# Patient Record
Sex: Female | Born: 1995 | Race: Black or African American | Hispanic: No | Marital: Single | Smoking: Never smoker
Health system: Southern US, Community
[De-identification: ages and names within clinical notes are randomized; demographics above are authoritative.]

## PROBLEM LIST (undated history)

## (undated) ENCOUNTER — Ambulatory Visit: Admission: EM | Disposition: A | Discharge status: Home / Self Care | Payer: 59

## (undated) DIAGNOSIS — Z789 Other specified health status: Secondary | ICD-10-CM

## (undated) HISTORY — PX: NO PAST SURGERIES: SHX2092

---

## 2016-07-06 ENCOUNTER — Ambulatory Visit (HOSPITAL_COMMUNITY)
Admission: EM | Admit: 2016-07-06 | Discharge: 2016-07-06 | Discharge status: Absent without leave | Payer: BC Managed Care – PPO

## 2016-07-06 ENCOUNTER — Emergency Department (HOSPITAL_COMMUNITY)
Admission: EM | Admit: 2016-07-06 | Discharge: 2016-07-06 | Disposition: A | Discharge status: Home / Self Care | Payer: BC Managed Care – PPO | Attending: Emergency Medicine | Admitting: Emergency Medicine

## 2016-07-06 ENCOUNTER — Encounter (HOSPITAL_COMMUNITY): Payer: Self-pay

## 2016-07-06 DIAGNOSIS — Z5321 Procedure and treatment not carried out due to patient leaving prior to being seen by health care provider: Secondary | ICD-10-CM | POA: Insufficient documentation

## 2016-07-06 DIAGNOSIS — R1084 Generalized abdominal pain: Secondary | ICD-10-CM | POA: Insufficient documentation

## 2016-07-06 LAB — COMPREHENSIVE METABOLIC PANEL
ALT: 44 U/L (ref 14–54)
AST: 38 U/L (ref 15–41)
Albumin: 3.5 g/dL (ref 3.5–5.0)
Alkaline Phosphatase: 45 U/L (ref 38–126)
Anion gap: 11 (ref 5–15)
BUN: 11 mg/dL (ref 6–20)
CO2: 21 mmol/L — ABNORMAL LOW (ref 22–32)
CREATININE: 0.82 mg/dL (ref 0.44–1.00)
Calcium: 9.6 mg/dL (ref 8.9–10.3)
Chloride: 106 mmol/L (ref 101–111)
GFR calc Af Amer: >60 mL/min (ref >60)
GLUCOSE: 101 mg/dL — AB (ref 65–99)
Potassium: 3.2 mmol/L — ABNORMAL LOW (ref 3.5–5.1)
SODIUM: 138 mmol/L (ref 135–145)
TOTAL PROTEIN: 7.9 g/dL (ref 6.5–8.1)
Total Bilirubin: 0.6 mg/dL (ref 0.3–1.2)

## 2016-07-06 LAB — URINALYSIS, ROUTINE W REFLEX MICROSCOPIC
GLUCOSE, UA: NEGATIVE mg/dL
KETONES UR: 15 mg/dL — AB
LEUKOCYTES UA: NEGATIVE
NITRITE: NEGATIVE
PH: 6 (ref 5.0–8.0)
PROTEIN: 100 mg/dL — AB
Specific Gravity, Urine: 1.03 (ref 1.005–1.030)

## 2016-07-06 LAB — CBC
HCT: 41.8 % (ref 36.0–46.0)
Hemoglobin: 14 g/dL (ref 12.0–15.0)
MCH: 30 pg (ref 26.0–34.0)
MCHC: 33.5 g/dL (ref 30.0–36.0)
MCV: 89.7 fL (ref 78.0–100.0)
PLATELETS: 310 10*3/uL (ref 150–400)
RBC: 4.66 MIL/uL (ref 3.87–5.11)
RDW: 13 % (ref 11.5–15.5)
WBC: 10.2 10*3/uL (ref 4.0–10.5)

## 2016-07-06 LAB — URINE MICROSCOPIC-ADD ON
BACTERIA UA: NONE SEEN
RBC / HPF: NONE SEEN RBC/hpf (ref 0–5)
WBC, UA: NONE SEEN WBC/hpf (ref 0–5)

## 2016-07-06 LAB — I-STAT BETA HCG BLOOD, ED (MC, WL, AP ONLY): I-stat hCG, quantitative: <5 m[IU]/mL (ref <5)

## 2016-07-06 LAB — LIPASE, BLOOD: LIPASE: 21 U/L (ref 11–51)

## 2016-07-06 NOTE — ED Notes (Signed)
Name called x 2, no answer.

## 2016-07-06 NOTE — ED Triage Notes (Signed)
Patient here with generalized abdominal pain with nausea and vomiting since Thursday. Denies diarrhea. Describes the abdominal discomfort as cramping.

## 2016-08-01 ENCOUNTER — Encounter (HOSPITAL_COMMUNITY): Payer: Self-pay | Admitting: Emergency Medicine

## 2016-08-01 ENCOUNTER — Encounter (HOSPITAL_COMMUNITY): Payer: Self-pay | Admitting: *Deleted

## 2016-08-01 ENCOUNTER — Ambulatory Visit (HOSPITAL_COMMUNITY)
Admission: EM | Admit: 2016-08-01 | Discharge: 2016-08-01 | Disposition: A | Discharge status: Nursing Home (Includes ICF) | Payer: BC Managed Care – PPO | Attending: Family Medicine | Admitting: Family Medicine

## 2016-08-01 DIAGNOSIS — N390 Urinary tract infection, site not specified: Secondary | ICD-10-CM | POA: Diagnosis not present

## 2016-08-01 DIAGNOSIS — R1110 Vomiting, unspecified: Secondary | ICD-10-CM | POA: Diagnosis not present

## 2016-08-01 DIAGNOSIS — R112 Nausea with vomiting, unspecified: Secondary | ICD-10-CM | POA: Diagnosis not present

## 2016-08-01 DIAGNOSIS — R1115 Cyclical vomiting syndrome unrelated to migraine: Secondary | ICD-10-CM

## 2016-08-01 LAB — CBC
HCT: 41.3 % (ref 36.0–46.0)
HEMOGLOBIN: 14 g/dL (ref 12.0–15.0)
MCH: 29.7 pg (ref 26.0–34.0)
MCHC: 33.9 g/dL (ref 30.0–36.0)
MCV: 87.7 fL (ref 78.0–100.0)
Platelets: 367 10*3/uL (ref 150–400)
RBC: 4.71 MIL/uL (ref 3.87–5.11)
RDW: 12.8 % (ref 11.5–15.5)
WBC: 16.6 10*3/uL — ABNORMAL HIGH (ref 4.0–10.5)

## 2016-08-01 LAB — COMPREHENSIVE METABOLIC PANEL
ALBUMIN: 3.5 g/dL (ref 3.5–5.0)
ALK PHOS: 59 U/L (ref 38–126)
ALT: 31 U/L (ref 14–54)
ANION GAP: 13 (ref 5–15)
AST: 30 U/L (ref 15–41)
BILIRUBIN TOTAL: 0.4 mg/dL (ref 0.3–1.2)
CALCIUM: 9.8 mg/dL (ref 8.9–10.3)
CO2: 22 mmol/L (ref 22–32)
Chloride: 103 mmol/L (ref 101–111)
Creatinine, Ser: 0.76 mg/dL (ref 0.44–1.00)
GFR calc Af Amer: >60 mL/min (ref >60)
GFR calc non Af Amer: >60 mL/min (ref >60)
GLUCOSE: 92 mg/dL (ref 65–99)
Potassium: 4.1 mmol/L (ref 3.5–5.1)
SODIUM: 138 mmol/L (ref 135–145)
Total Protein: 8.3 g/dL — ABNORMAL HIGH (ref 6.5–8.1)

## 2016-08-01 LAB — URINALYSIS, ROUTINE W REFLEX MICROSCOPIC
Glucose, UA: NEGATIVE mg/dL
HGB URINE DIPSTICK: NEGATIVE
KETONES UR: 15 mg/dL — AB
Nitrite: POSITIVE — AB
PH: 6 (ref 5.0–8.0)
Protein, ur: 100 mg/dL — AB
SPECIFIC GRAVITY, URINE: 1.031 — AB (ref 1.005–1.030)

## 2016-08-01 LAB — URINE MICROSCOPIC-ADD ON

## 2016-08-01 LAB — POC URINE PREG, ED: PREG TEST UR: NEGATIVE

## 2016-08-01 MED ORDER — ONDANSETRON 4 MG PO TBDP
ORAL_TABLET | ORAL | Status: AC
  Filled 2016-08-01: qty 1

## 2016-08-01 MED ORDER — ONDANSETRON 4 MG PO TBDP
4.0000 mg | ORAL_TABLET | Freq: Once | ORAL | Status: AC
  Administered 2016-08-01: 4 mg via ORAL

## 2016-08-01 NOTE — ED Triage Notes (Signed)
Pt c/o NV and abdominal pain onset today. Has had 6-7 episodes of emesis today. [facility] and sent to ER for IV fluids. Denies nausea. Was given nausea medication at [facility].

## 2016-08-01 NOTE — ED Provider Notes (Signed)
[facility]    CSN: [id number] Arrival date & time: 08/01/16  1826     History   Chief Complaint No chief complaint on file.   HPI Cassandra Ali is a 20 y.o. female.    Emesis  Severity:  Moderate Duration:  1 day Number of daily episodes:  6 Quality:  Stomach contents Progression:  Worsening Chronicity:  Recurrent Recent urination:  Normal Relieved by:  Nothing Worsened by:  Nothing Ineffective treatments:  None tried Associated symptoms: no abdominal pain, no diarrhea and no fever   Risk factors: not pregnant, no sick contacts and no suspect food intake   Risk factors comment:  Similar vomiting in sept requiring ivf.   No past medical history on file.  There are no active problems to display for this patient.   No past surgical history on file.  OB History    No data available       Home Medications    Prior to Admission medications   Not on File    Family History No family history on file.  Social History Social History  Substance Use Topics  . Smoking status: Never Smoker  . Smokeless tobacco: Never Used  . Alcohol use Not on file     Allergies   Review of patient's allergies indicates no known allergies.   Review of Systems Review of Systems  Constitutional: Positive for appetite change. Negative for fever.  Gastrointestinal: Positive for vomiting. Negative for abdominal pain and diarrhea.  Genitourinary: Negative.      Physical Exam Triage Vital Signs ED Triage Vitals [08/01/16 1841]  Enc Vitals Group     BP 128/75     Pulse Rate 64     Resp 14     Temp 98.1 F (36.7 C)     Temp Source Oral     SpO2 100 %     Weight      Height      Head Circumference      Peak Flow      Pain Score      Pain Loc      Pain Edu?      Excl. in GC?    No data found.   Updated Vital Signs BP 128/75 (BP Location: Left Arm)   Pulse 64   Temp 98.1 F (36.7 C) (Oral)   Resp 14   LMP 06/22/2016   SpO2 100%   Visual  Acuity Right Eye Distance:   Left Eye Distance:   Bilateral Distance:    Right Eye Near:   Left Eye Near:    Bilateral Near:     Physical Exam  Constitutional: She is oriented to person, place, and time. She appears well-developed and well-nourished. She appears distressed.  Cardiovascular: Normal rate and regular rhythm.   Pulmonary/Chest: Effort normal.  Abdominal: Soft. Bowel sounds are normal. She exhibits no distension and no mass. There is tenderness. There is no rebound and no guarding. No hernia.  Neurological: She is alert and oriented to person, place, and time.  Skin: Skin is warm and dry.  Psychiatric: She has a normal mood and affect.  Nursing note and vitals reviewed.    UC Treatments / Results  Labs (all labs ordered are listed, but only abnormal results are displayed) Labs Reviewed - No data to display  EKG  EKG Interpretation None       Radiology No results found.  Procedures Procedures (including critical care time)  Medications Ordered in UC  Medications - No data to display   Initial Impression / Assessment and Plan / UC Course  I have reviewed the triage vital signs and the nursing notes.  Pertinent labs & imaging results that were available during my care of the patient were reviewed by me and considered in my medical decision making (see chart for details).  Clinical Course    Sent for eval of relapsing vomiting and possible ivf.treatment.  Final Clinical Impressions(s) / UC Diagnoses   Final diagnoses:  None    New Prescriptions New Prescriptions   No medications on file     Linna Hoff, MD 08/01/16 1909

## 2016-08-01 NOTE — ED Triage Notes (Signed)
Patient had an event of nausea, vomiting, and diarrhea last month.  This episode started today.  Patient has generalized abdominal pain.  Patient has had 6-7 episodes of vomiting today.  Patient has had one minimal diarrhea stool.

## 2016-08-02 ENCOUNTER — Emergency Department (HOSPITAL_COMMUNITY)
Admission: EM | Admit: 2016-08-02 | Discharge: 2016-08-02 | Disposition: A | Discharge status: Home / Self Care | Payer: BC Managed Care – PPO | Attending: Emergency Medicine | Admitting: Emergency Medicine

## 2016-08-02 DIAGNOSIS — R112 Nausea with vomiting, unspecified: Secondary | ICD-10-CM

## 2016-08-02 DIAGNOSIS — N390 Urinary tract infection, site not specified: Secondary | ICD-10-CM

## 2016-08-02 MED ORDER — CEPHALEXIN 500 MG PO CAPS: 500.0000 mg | ORAL_CAPSULE | Freq: Four times a day (QID) | ORAL | 0 refills | Status: DC

## 2016-08-02 MED ORDER — ONDANSETRON 4 MG PO TBDP: ORAL_TABLET | ORAL | 0 refills | Status: DC

## 2016-08-02 NOTE — ED Notes (Signed)
Pt left without discharge paperwork or discharge teaching

## 2016-08-02 NOTE — Discharge Instructions (Signed)
1. Medications: zofran, usual home medications 2. Treatment: rest, drink plenty of fluids, advance diet slowly, stop smoking marijuana 3. Follow Up: Please followup with your primary doctor in 2 days for discussion of your diagnoses and further evaluation after today's visit; if you do not have a primary care doctor use the resource guide provided to find one; Please return to the ER for persistent vomiting, high fevers or worsening symptoms

## 2016-08-02 NOTE — ED Notes (Signed)
Pt was seen walking out door to lobby.  Pt didn't answer when name was called

## 2016-08-02 NOTE — ED Provider Notes (Signed)
[facility]   CSN: [id number] Arrival date & time: 08/01/16  1936     History   Chief Complaint Chief Complaint  Patient presents with  . Emesis    HPI Cassandra Ali is a 20 y.o. female with no major medical problems presents to the Emergency Department complaining of 6-7 episodes of vomiting onset today around noon.  Pt reports emesis is NBNB.  She reports she was seen at [facility] tonight and transferred here for evaluation. No associated symptoms.  No aggravating or alleviating factors.  Pt denies associated abd pain, fever, chills, headache, neck pain, chest pain, SOB, diarrhea.    Pt reports this happens every few weeks for the last year.  She does endorse daily marijuana usage.  Denies other drug usage.  Reports intermittent EtOH usage but none in the last 24 hours.  No sick contacts.  No international travel.   The history is provided by the patient and medical records. No language interpreter was used.    History reviewed. No pertinent past medical history.  There are no active problems to display for this patient.   History reviewed. No pertinent surgical history.  OB History    No data available       Home Medications    Prior to Admission medications   Medication Sig Start Date End Date Taking? Authorizing Provider  PRESCRIPTION MEDICATION Take 1 tablet by mouth daily. Birth Control   Yes Historical Provider, MD  cephALEXin (KEFLEX) 500 MG capsule Take 1 capsule (500 mg total) by mouth 4 (four) times daily. 08/02/16   Kanoa Phillippi, PA-C  ondansetron (ZOFRAN ODT) 4 MG disintegrating tablet 4mg  ODT q4 hours prn nausea/vomit 08/02/16   Dahlia Client Rontae Inglett, PA-C    Family History No family history on file.  Social History Social History  Substance Use Topics  . Smoking status: Never Smoker  . Smokeless tobacco: Never Used  . Alcohol use No     Allergies   Review of patient's allergies indicates no known allergies.   Review  of Systems Review of Systems  Gastrointestinal: Positive for nausea and vomiting.  All other systems reviewed and are negative.    Physical Exam Updated Vital Signs BP 121/79   Pulse 98   Temp 100 F (37.8 C)   Resp 18   Ht 5' 8.25" (1.734 m)   Wt 66.7 kg   LMP 06/22/2016   SpO2 100%   BMI 22.19 kg/m   Physical Exam  Constitutional: She appears well-developed and well-nourished. No distress.  Awake, alert, nontoxic appearance  HENT:  Head: Normocephalic and atraumatic.  Mouth/Throat: Oropharynx is clear and moist. No oropharyngeal exudate.  Eyes: Conjunctivae are normal. No scleral icterus.  Neck: Normal range of motion. Neck supple.  Cardiovascular: Normal rate, regular rhythm and intact distal pulses.   Pulmonary/Chest: Effort normal and breath sounds normal. No respiratory distress. She has no wheezes.  Equal chest expansion  Abdominal: Soft. Bowel sounds are normal. She exhibits no mass. There is no tenderness. There is no rebound and no guarding.  Musculoskeletal: Normal range of motion. She exhibits no edema.  Neurological: She is alert.  Speech is clear and goal oriented Moves extremities without ataxia  Skin: Skin is warm and dry. She is not diaphoretic.  Psychiatric: She has a normal mood and affect.  Nursing note and vitals reviewed.    ED Treatments / Results  Labs (all labs ordered are listed, but only abnormal results are displayed) Labs Reviewed  COMPREHENSIVE  METABOLIC PANEL - Abnormal; Notable for the following:       Result Value   BUN <5 (*)    Total Protein 8.3 (*)    All other components within normal limits  CBC - Abnormal; Notable for the following:    WBC 16.6 (*)    All other components within normal limits  URINALYSIS, ROUTINE W REFLEX MICROSCOPIC (NOT [facility]) - Abnormal; Notable for the following:    Color, Urine AMBER (*)    APPearance CLOUDY (*)    Specific Gravity, Urine 1.031 (*)    Bilirubin Urine MODERATE (*)    Ketones, ur  15 (*)    Protein, ur 100 (*)    Nitrite POSITIVE (*)    Leukocytes, UA TRACE (*)    All other components within normal limits  URINE MICROSCOPIC-ADD ON - Abnormal; Notable for the following:    Squamous Epithelial / LPF 6-30 (*)    Bacteria, UA FEW (*)    All other components within normal limits  URINE CULTURE  POC URINE PREG, ED     Procedures Procedures (including critical care time)  Medications Ordered in ED Medications - No data to display   Initial Impression / Assessment and Plan / ED Course  I have reviewed the triage vital signs and the nursing notes.  Pertinent labs & imaging results that were available during my care of the patient were reviewed by me and considered in my medical decision making (see chart for details).  Clinical Course  Value Comment By Time  Leukocytes, UA: (!) TRACE Urine with evidence of UTI, but pt denies dysuria or other urinary symptoms Dierdre ForthHannah Myalynn Lingle, PA-C 10/20 0237  WBC: (!) 16.6 leukocytosis Dierdre ForthHannah Ameris Akamine, PA-C 10/20 [id number]  Preg Test, Ur: NEGATIVE Neg Chenille Toor, PA-C 10/20 0238   Pt reports that she does not want an IV as she does not like needles.  She is able to tolerate PO here in the [facility] Jerem[facility] Tarpley, PA-C 10/20 0243    Pt Sent from urgent care for IV hydration after several episodes of emesis today. Patient's urine shows evidence of UTI and some dehydration. Patient reports that she got tired of waiting in the waiting room and drank an entire ginger ale without further emesis. She refuses the IV stating that she does not like needles. Her lab work is reassuring. Abdomen is soft and nontender. She does endorse regular marijuana usage and I suspect this is the reason for her emesis. Discussed usage and cyclic vomiting syndrome. She states understanding. She'll be discharged home with instructions to advance diet slowly. Strict return precautions given.  Patient denies urinary symptoms however UA is  consistent with UTI. Will begin treatment as she is here with vomiting and has a leukocytosis on her CBC. Urine culture sent.  Final Clinical Impressions(s) / ED Diagnoses   Final diagnoses:  Nausea and vomiting, intractability of vomiting not specified, unspecified vomiting type  Urinary tract infection without hematuria, site unspecified    New Prescriptions New Prescriptions   CEPHALEXIN (KEFLEX) 500 MG CAPSULE    Take 1 capsule (500 mg total) by mouth 4 (four) times daily.   ONDANSETRON (ZOFRAN ODT) 4 MG DISINTEGRATING TABLET    4mg  ODT q4 hours prn nausea/vomit     Dierdre ForthHannah Ulyses Panico, PA-C 08/02/16 0300    Layla MawKristen N Ward, DO 08/02/16 [phone]

## 2016-08-03 LAB — URINE CULTURE

## 2021-02-06 ENCOUNTER — Other Ambulatory Visit: Payer: Self-pay

## 2021-02-06 ENCOUNTER — Ambulatory Visit (HOSPITAL_COMMUNITY)
Admission: EM | Admit: 2021-02-06 | Discharge: 2021-02-06 | Disposition: A | Discharge status: Home / Self Care | Payer: 59 | Attending: Internal Medicine | Admitting: Internal Medicine

## 2021-02-06 ENCOUNTER — Encounter (HOSPITAL_COMMUNITY): Payer: Self-pay | Admitting: Obstetrics and Gynecology

## 2021-02-06 ENCOUNTER — Inpatient Hospital Stay (HOSPITAL_COMMUNITY): Payer: 59

## 2021-02-06 ENCOUNTER — Encounter (HOSPITAL_COMMUNITY): Payer: Self-pay

## 2021-02-06 ENCOUNTER — Inpatient Hospital Stay (HOSPITAL_COMMUNITY)
Admission: AD | Admit: 2021-02-06 | Discharge: 2021-02-06 | Disposition: A | Discharge status: Home / Self Care | Payer: 59 | Attending: Obstetrics and Gynecology | Admitting: Obstetrics and Gynecology

## 2021-02-06 DIAGNOSIS — O99282 Endocrine, nutritional and metabolic diseases complicating pregnancy, second trimester: Secondary | ICD-10-CM | POA: Insufficient documentation

## 2021-02-06 DIAGNOSIS — O26899 Other specified pregnancy related conditions, unspecified trimester: Secondary | ICD-10-CM

## 2021-02-06 DIAGNOSIS — O99281 Endocrine, nutritional and metabolic diseases complicating pregnancy, first trimester: Secondary | ICD-10-CM | POA: Diagnosis not present

## 2021-02-06 DIAGNOSIS — R1013 Epigastric pain: Secondary | ICD-10-CM

## 2021-02-06 DIAGNOSIS — R109 Unspecified abdominal pain: Secondary | ICD-10-CM | POA: Diagnosis not present

## 2021-02-06 DIAGNOSIS — O210 Mild hyperemesis gravidarum: Secondary | ICD-10-CM

## 2021-02-06 DIAGNOSIS — O219 Vomiting of pregnancy, unspecified: Secondary | ICD-10-CM

## 2021-02-06 DIAGNOSIS — R1110 Vomiting, unspecified: Secondary | ICD-10-CM | POA: Diagnosis not present

## 2021-02-06 DIAGNOSIS — Z3A08 8 weeks gestation of pregnancy: Secondary | ICD-10-CM | POA: Diagnosis not present

## 2021-02-06 DIAGNOSIS — E876 Hypokalemia: Secondary | ICD-10-CM | POA: Diagnosis not present

## 2021-02-06 DIAGNOSIS — Z3A01 Less than 8 weeks gestation of pregnancy: Secondary | ICD-10-CM | POA: Insufficient documentation

## 2021-02-06 DIAGNOSIS — O26891 Other specified pregnancy related conditions, first trimester: Secondary | ICD-10-CM

## 2021-02-06 DIAGNOSIS — Z3201 Encounter for pregnancy test, result positive: Secondary | ICD-10-CM | POA: Diagnosis not present

## 2021-02-06 DIAGNOSIS — R112 Nausea with vomiting, unspecified: Secondary | ICD-10-CM

## 2021-02-06 HISTORY — DX: Other specified health status: Z78.9

## 2021-02-06 LAB — CBC WITH DIFFERENTIAL/PLATELET
Abs Immature Granulocytes: 0.08 10*3/uL — ABNORMAL HIGH (ref 0.00–0.07)
Basophils Absolute: 0.1 10*3/uL (ref 0.0–0.1)
Basophils Relative: 0 %
Eosinophils Absolute: 0 10*3/uL (ref 0.0–0.5)
Eosinophils Relative: 0 %
HCT: 44.5 % (ref 36.0–46.0)
Hemoglobin: 14.8 g/dL (ref 12.0–15.0)
Immature Granulocytes: 1 %
Lymphocytes Relative: 10 %
Lymphs Abs: 1.5 10*3/uL (ref 0.7–4.0)
MCH: 30 pg (ref 26.0–34.0)
MCHC: 33.3 g/dL (ref 30.0–36.0)
MCV: 90.1 fL (ref 80.0–100.0)
Monocytes Absolute: 0.9 10*3/uL (ref 0.1–1.0)
Monocytes Relative: 6 %
Neutro Abs: 12.7 10*3/uL — ABNORMAL HIGH (ref 1.7–7.7)
Neutrophils Relative %: 83 %
Platelets: 338 10*3/uL (ref 150–400)
RBC: 4.94 MIL/uL (ref 3.87–5.11)
RDW: 13.8 % (ref 11.5–15.5)
WBC: 15.2 10*3/uL — ABNORMAL HIGH (ref 4.0–10.5)
nRBC: 0 % (ref 0.0–0.2)

## 2021-02-06 LAB — POC URINE PREG, ED: Preg Test, Ur: POSITIVE — AB

## 2021-02-06 LAB — URINALYSIS, ROUTINE W REFLEX MICROSCOPIC
Bilirubin Urine: NEGATIVE
Glucose, UA: NEGATIVE mg/dL
Hgb urine dipstick: NEGATIVE
Ketones, ur: 20 mg/dL — AB
Leukocytes,Ua: NEGATIVE
Nitrite: NEGATIVE
Protein, ur: NEGATIVE mg/dL
Specific Gravity, Urine: 1.021 (ref 1.005–1.030)
pH: 6 (ref 5.0–8.0)

## 2021-02-06 LAB — COMPREHENSIVE METABOLIC PANEL
ALT: 28 U/L (ref 0–44)
AST: 30 U/L (ref 15–41)
Albumin: 4.2 g/dL (ref 3.5–5.0)
Alkaline Phosphatase: 62 U/L (ref 38–126)
Anion gap: 14 (ref 5–15)
BUN: 11 mg/dL (ref 6–20)
CO2: 26 mmol/L (ref 22–32)
Calcium: 9.9 mg/dL (ref 8.9–10.3)
Chloride: 91 mmol/L — ABNORMAL LOW (ref 98–111)
Creatinine, Ser: 0.73 mg/dL (ref 0.44–1.00)
GFR, Estimated: >60 mL/min (ref >60)
Glucose, Bld: 85 mg/dL (ref 70–99)
Potassium: 2.9 mmol/L — ABNORMAL LOW (ref 3.5–5.1)
Sodium: 131 mmol/L — ABNORMAL LOW (ref 135–145)
Total Bilirubin: 0.7 mg/dL (ref 0.3–1.2)
Total Protein: 8.6 g/dL — ABNORMAL HIGH (ref 6.5–8.1)

## 2021-02-06 LAB — HCG, QUANTITATIVE, PREGNANCY: hCG, Beta Chain, Quant, S: 49856 m[IU]/mL — ABNORMAL HIGH (ref <5)

## 2021-02-06 LAB — POCT URINALYSIS DIPSTICK, ED / UC
Glucose, UA: NEGATIVE mg/dL
Hgb urine dipstick: NEGATIVE
Ketones, ur: 80 mg/dL — AB
Leukocytes,Ua: NEGATIVE
Nitrite: NEGATIVE
Protein, ur: 100 mg/dL — AB
Specific Gravity, Urine: 1.025 (ref 1.005–1.030)
Urobilinogen, UA: 0.2 mg/dL (ref 0.0–1.0)
pH: 7 (ref 5.0–8.0)

## 2021-02-06 LAB — WET PREP, GENITAL
Sperm: NONE SEEN
Trich, Wet Prep: NONE SEEN
Yeast Wet Prep HPF POC: NONE SEEN

## 2021-02-06 LAB — ABO/RH
ABO/RH(D): O NEG
Antibody Screen: NEGATIVE

## 2021-02-06 MED ORDER — FAMOTIDINE IN NACL 20-0.9 MG/50ML-% IV SOLN
20.0000 mg | Freq: Once | INTRAVENOUS | Status: AC
  Administered 2021-02-06: 20 mg via INTRAVENOUS
  Filled 2021-02-06: qty 50

## 2021-02-06 MED ORDER — LACTATED RINGERS IV BOLUS
1000.0000 mL | Freq: Once | INTRAVENOUS | Status: AC
  Administered 2021-02-06: 1000 mL via INTRAVENOUS

## 2021-02-06 MED ORDER — METOCLOPRAMIDE HCL 10 MG PO TABS: 10.0000 mg | ORAL_TABLET | Freq: Three times a day (TID) | ORAL | 1 refills | Status: AC

## 2021-02-06 MED ORDER — METOCLOPRAMIDE HCL 5 MG/ML IJ SOLN
10.0000 mg | Freq: Once | INTRAMUSCULAR | Status: AC
  Administered 2021-02-06: 10 mg via INTRAVENOUS
  Filled 2021-02-06: qty 2

## 2021-02-06 MED ORDER — POTASSIUM CHLORIDE CRYS ER 10 MEQ PO TBCR: 10.0000 meq | EXTENDED_RELEASE_TABLET | Freq: Every day | ORAL | 0 refills | Status: AC

## 2021-02-06 MED ORDER — POTASSIUM CHLORIDE CRYS ER 10 MEQ PO TBCR
10.0000 meq | EXTENDED_RELEASE_TABLET | Freq: Once | ORAL | Status: AC
  Administered 2021-02-06: 10 meq via ORAL
  Filled 2021-02-06: qty 1

## 2021-02-06 MED ORDER — FAMOTIDINE 20 MG PO TABS: 20.0000 mg | ORAL_TABLET | Freq: Every day | ORAL | 1 refills | Status: AC

## 2021-02-06 NOTE — ED Provider Notes (Signed)
[facility]    CSN: [id number] Arrival date & time: 02/06/21  0940      History   Chief Complaint Chief Complaint  Patient presents with  . Emesis  . Abdominal Pain    HPI Cassandra Ali is a 25 y.o. female presenting with vomiting and abdominal pain.  States her last period was about 8 weeks ago.  She has not taken a pregnancy test.  Notes that she ate out 2 days ago and some other members of the party are also having the same symptoms.  States she has vomited many many times daily for the last 4 days and cannot keep any fluids or food down.  Epigastric pain constantly.  Some diarrhea.  Denies fever/chills, cough, congestion, headaches, body aches.  Denies urinary symptoms like dysuria, frequency.  Denies vaginal spotting, vaginal discharge.  HPI  History reviewed. No pertinent past medical history.  There are no problems to display for this patient.   History reviewed. No pertinent surgical history.  OB History    Gravida  1   Para      Term      Preterm      AB      Living        SAB      IAB      Ectopic      Multiple      Live Births               Home Medications    Prior to Admission medications   Not on File    Family History Family History  Problem Relation Age of Onset  . Healthy Mother   . Healthy Father     Social History Social History   Tobacco Use  . Smoking status: Never Smoker  . Smokeless tobacco: Never Used  Substance Use Topics  . Alcohol use: No  . Drug use: No     Allergies   Patient has no known allergies.   Review of Systems Review of Systems  Constitutional: Negative for appetite change, chills, diaphoresis, fever and unexpected weight change.  HENT: Negative for congestion, ear pain, sinus pressure, sinus pain, sneezing, sore throat and trouble swallowing.   Respiratory: Negative for cough, chest tightness and shortness of breath.   Cardiovascular: Negative for chest pain.   Gastrointestinal: Positive for abdominal pain, nausea and vomiting. Negative for abdominal distention, anal bleeding, blood in stool, constipation, diarrhea and rectal pain.  Genitourinary: Negative for dysuria, flank pain, frequency and urgency.  Musculoskeletal: Negative for back pain and myalgias.  Neurological: Negative for dizziness, light-headedness and headaches.  All other systems reviewed and are negative.    Physical Exam Triage Vital Signs ED Triage Vitals  Enc Vitals Group     BP 02/06/21 1023 118/73     Pulse Rate 02/06/21 1023 72     Resp 02/06/21 1023 18     Temp 02/06/21 1023 98.5 F (36.9 C)     Temp src --      SpO2 02/06/21 1023 99 %     Weight --      Height --      Head Circumference --      Peak Flow --      Pain Score 02/06/21 1021 6     Pain Loc --      Pain Edu? --      Excl. in GC? --    No data found.  Updated Vital Signs BP 118/73  Pulse 72   Temp 98.5 F (36.9 C)   Resp 18   LMP 12/15/2020   SpO2 99%   Visual Acuity Right Eye Distance:   Left Eye Distance:   Bilateral Distance:    Right Eye Near:   Left Eye Near:    Bilateral Near:     Physical Exam Vitals reviewed.  Constitutional:      General: She is not in acute distress.    Appearance: Normal appearance. She is ill-appearing.  HENT:     Head: Normocephalic and atraumatic.     Mouth/Throat:     Mouth: Mucous membranes are moist.     Comments: Moist mucous membranes Eyes:     Extraocular Movements: Extraocular movements intact.     Pupils: Pupils are equal, round, and reactive to light.  Cardiovascular:     Rate and Rhythm: Normal rate and regular rhythm.     Heart sounds: Normal heart sounds.  Pulmonary:     Effort: Pulmonary effort is normal.     Breath sounds: Normal breath sounds. No wheezing, rhonchi or rales.  Abdominal:     General: Bowel sounds are normal. There is no distension.     Palpations: Abdomen is soft. There is no mass.     Tenderness: There is  abdominal tenderness in the epigastric area. There is no right CVA tenderness, left CVA tenderness, guarding or rebound. Negative signs include Murphy's sign, Rovsing's sign and McBurney's sign.  Skin:    General: Skin is warm.     Capillary Refill: Capillary refill takes 2 to 3 seconds.     Comments: Good skin turgor  Neurological:     General: No focal deficit present.     Mental Status: She is alert and oriented to person, place, and time.  Psychiatric:        Mood and Affect: Mood normal.        Behavior: Behavior normal.      UC Treatments / Results  Labs (all labs ordered are listed, but only abnormal results are displayed) Labs Reviewed  POCT URINALYSIS DIPSTICK, ED / UC - Abnormal; Notable for the following components:      Result Value   Bilirubin Urine SMALL (*)    Ketones, ur 80 (*)    Protein, ur 100 (*)    All other components within normal limits  POC URINE PREG, ED - Abnormal; Notable for the following components:   Preg Test, Ur POSITIVE (*)    All other components within normal limits    EKG   Radiology No results found.  Procedures Procedures (including critical care time)  Medications Ordered in UC Medications - No data to display  Initial Impression / Assessment and Plan / UC Course  I have reviewed the triage vital signs and the nursing notes.  Pertinent labs & imaging results that were available during my care of the patient were reviewed by me and considered in my medical decision making (see chart for details).     This patient is a 25 year old female presenting for epigastric abdominal pain and intractable nausea and vomiting.  This patient's last period was 8 weeks ago, urine pregnancy test today is positive.  On exam, she is exquisitely tender in the epigastric region but no other abdominal tenderness.  She appears dehydrated though her vital signs are still in normal range.  I believe that her epigastric pain can be attributed to food  poisoning.  However given intractable nausea and vomiting for 4  days, I am concerned that patient is dehydrated and becoming more so.  As we are somewhat limited on options for treatment of nausea and vomiting in pregnancy, and she has not followed by an OB/GYN, I am recommending she had to the [facility]U for further evaluation and management. May require IV fluids. She is in agreement and states that she wishes to go.  Hemodynamically stable for transport and personal vehicle at this time.  Final Clinical Impressions(s) / UC Diagnoses   Final diagnoses:  Intractable nausea and vomiting  [redacted] weeks gestation of pregnancy  Abdominal pain, epigastric     Discharge Instructions     Head to the [facility] at [facility]. If you experience worsening of symptoms while on the way, like worsening abdominal pain, dizziness, shortness of breath, chest pain- stop and call 911    ED Prescriptions    None     PDMP not reviewed this encounter.   Rhys Martini, PA-C 02/06/21 1210

## 2021-02-06 NOTE — Discharge Instructions (Addendum)
Head to the [facility] at [facility]. If you experience worsening of symptoms while on the way, like worsening abdominal pain, dizziness, shortness of breath, chest pain- stop and call 911

## 2021-02-06 NOTE — ED Notes (Signed)
Patient is being discharged from the Urgent Care and sent to the MAU via POV . Per Ignacia Bayley, PA, patient is in need of higher level of care due to emesis and poor PO intake with positive pregnancy test. Patient is aware and verbalizes understanding of plan of care.  Vitals:   02/06/21 1023  BP: 118/73  Pulse: 72  Resp: 18  Temp: 98.5 F (36.9 C)  SpO2: 99%

## 2021-02-06 NOTE — ED Notes (Signed)
Ignacia Bayley PA notified of positive pregnancy result. Provider states will go to see pt.

## 2021-02-06 NOTE — ED Triage Notes (Addendum)
Pt c/o upper abdominal pain and emesis since this past weekend. Denies other symptoms. LMP 12/15/20. Pt reports not being on birth control. States she has been feeling weak from not eating.

## 2021-02-06 NOTE — ED Notes (Signed)
Cassandra Ali, [place] states is still deciding whether to send pt to MAU due to abdominal pain being in upper abdomen. Awaiting provider direction.

## 2021-02-06 NOTE — Discharge Instructions (Signed)
Abdominal Pain During Pregnancy Abdominal pain is common during pregnancy and has many possible causes. Some causes are more serious than others, and sometimes the cause is not known. Abdominal pain can be a sign that labor is starting. It can also be caused by normal growth of your baby causing stretching of muscles and ligaments during pregnancy. Always tell your health care provider if you have any abdominal pain. Follow these instructions at home:  Do not have sex or put anything in your vagina until your pain goes away completely.  Get plenty of rest until your pain improves.  Drink enough fluid to keep your urine pale yellow.  Take over-the-counter and prescription medicines only as told by your health care provider.  Keep all follow-up visits. This is important.   Contact a health care provider if:  Your pain continues or gets worse after resting.  You have lower abdominal pain that: ? Comes and goes at regular intervals. ? Spreads to your back. ? Is similar to menstrual cramps.  You have pain or burning when you urinate. Get help right away if:  You have a fever, chills, or shortness of breath.  You have vaginal bleeding.  You are leaking fluid or passing tissue from your vagina.  You have vomiting or diarrhea that lasts for more than 24 hours.  Your baby is moving less than usual.  You feel very weak or faint.  You develop severe pain in your upper abdomen. Summary  Abdominal pain is common during pregnancy and has many possible causes.  If you experience abdominal pain during pregnancy, tell your health care provider right away.  Follow your health care provider's home care instructions and keep all follow-up visits as told. This information is not intended to replace advice given to you by your health care provider. Make sure you discuss any questions you have with your health care provider. Document Revised: 06/13/2020 Document Reviewed: 06/13/2020 Elsevier  Patient Education  2021 Elsevier Inc.        Morning Sickness  Morning sickness is when a woman feels nauseous during pregnancy. This nauseous feeling may or may not come with vomiting. It often occurs in the morning, but it can be a problem at any time of day. Morning sickness is most common during the first trimester. In some cases, it may continue throughout pregnancy. Although morning sickness is unpleasant, it is usually harmless unless the woman develops severe and continual vomiting (hyperemesis gravidarum), a condition that requires more intense treatment. What are the causes? The exact cause of this condition is not known, but it seems to be related to normal hormonal changes that occur in pregnancy. What increases the risk? You are more likely to develop this condition if:  You experienced nausea or vomiting before your pregnancy.  You had morning sickness during a previous pregnancy.  You are pregnant with more than one baby, such as twins. What are the signs or symptoms? Symptoms of this condition include:  Nausea.  Vomiting. How is this diagnosed? This condition is usually diagnosed based on your signs and symptoms. How is this treated? In many cases, treatment is not needed for this condition. Making some changes to what you eat may help to control symptoms. Your health care provider may also prescribe or recommend:  Vitamin B6 supplements.  Anti-nausea medicines.  Ginger. Follow these instructions at home: Medicines  Take over-the-counter and prescription medicines only as told by your health care provider. Do not use any prescription, over-the-counter, or  herbal medicines for morning sickness without first talking with your health care provider.  Take multivitamins before getting pregnant. This can prevent or decrease the severity of morning sickness in most women. Eating and drinking  Eat a piece of dry toast or crackers before getting out of bed in the  morning.  Eat 5 or 6 small meals a day.  Eat dry and bland foods, such as rice or a baked potato. Foods that are high in carbohydrates are often helpful.  Avoid greasy, fatty, and spicy foods.  Have someone cook for you if the smell of any food causes nausea and vomiting.  If you feel nauseous after taking prenatal vitamins, take the vitamins at night or with a snack.  Eat a protein snack between meals if you are hungry. Nuts, yogurt, and cheese are good options.  Drink fluids throughout the day.  Try ginger ale made with real ginger, ginger tea made from fresh grated ginger, or ginger candies. General instructions  Do not use any products that contain nicotine or tobacco. These products include cigarettes, chewing tobacco, and vaping devices, such as e-cigarettes. If you need help quitting, ask your health care provider.  Get an air purifier to keep the air in your house free of odors.  Get plenty of fresh air.  Try to avoid odors that trigger your nausea.  Consider trying these methods to help relieve symptoms: ? Wearing an acupressure wristband. These wristbands are often worn for seasickness. ? Acupuncture. Contact a health care provider if:  Your home remedies are not working and you need medicine.  You feel dizzy or light-headed.  You are losing weight. Get help right away if:  You have persistent and uncontrolled nausea and vomiting.  You faint.  You have severe pain in your abdomen. Summary  Morning sickness is when a woman feels nauseous during pregnancy. This nauseous feeling may or may not come with vomiting.  Morning sickness is most common during the first trimester.  It often occurs in the morning, but it can be a problem at any time of day.  In many cases, treatment is not needed for this condition. Making some changes to what you eat may help to control symptoms. This information is not intended to replace advice given to you by your health care  provider. Make sure you discuss any questions you have with your health care provider. Document Revised: 05/15/2020 Document Reviewed: 04/24/2020 Elsevier Patient Education  2021 Elsevier Inc.        Hyperemesis Gravidarum Hyperemesis gravidarum is a severe form of nausea and vomiting that happens during pregnancy. Hyperemesis is worse than morning sickness. It may cause you to have nausea or vomiting all day for many days. It may keep you from eating and drinking enough food and liquids, which can lead to dehydration, malnutrition, and weight loss. Hyperemesis usually occurs during the first half (the first 20 weeks) of pregnancy. It often goes away once a woman is in her second half of pregnancy. However, sometimes hyperemesis continues through an entire pregnancy. What are the causes? The cause of this condition is not known. It may be associated with:  Changes in hormones in the body during pregnancy.  Changes in the gastrointestinal system.  Genetic or inherited conditions. What are the signs or symptoms? Symptoms of this condition include:  Severe nausea and vomiting that does not go away.  Problems keeping food down.  Weight loss.  Loss of body fluid (dehydration).  Loss of appetite. You  may have no desire to eat or you may not like the food you have previously enjoyed. How is this diagnosed? This condition may be diagnosed based on your medical history, your symptoms, and a physical exam. You may also have other tests, including:  Blood tests.  Urine tests.  Blood pressure tests.  Ultrasound to look for problems with the placenta or to check if you are pregnant with more than one baby. How is this treated? This condition is managed by controlling symptoms. This may include:  Following an eating plan. This can help to lessen nausea and vomiting.  Treatments that do not use medicine. These include acupressure bracelets, hypnosis, and eating or drinking foods or  fluids that contain ginger, ginger ale, or ginger tea.  Taking prescription medicine or over-the-counter medicine as told by your health care provider.  Continuing to take prenatal vitamins. You may need to change what kind you take and when you take them. Follow your health care provider's instructions about prenatal vitamins. An eating plan and medicines are often used together to help control symptoms. If medicines do not help relieve nausea and vomiting, you may need to receive fluids through an IV at the hospital. Follow these instructions at home: To help relieve your symptoms, listen to your body. Everyone is different and has different preferences. Find what works best for you. Here are some things you can try to help relieve your symptoms: Meals and snacks  Eat 5-6 small meals daily instead of 3 large meals. Eating small meals and snacks can help you avoid an empty stomach.  Before getting out of bed, eat a couple of crackers to avoid moving around on an empty stomach.  Eat a protein-rich snack before bed. Examples include cheese and crackers, or a peanut butter sandwich made with 1 slice of whole-wheat bread and 1 tsp (5 g) of peanut butter.  Eat and drink slowly.  Try eating starchy foods as these are usually tolerated well. Examples include cereal, toast, bread, potatoes, pasta, rice, and pretzels.  Eat at least one serving of protein with your meals and snacks. Protein options include lean meats, poultry, seafood, beans, nuts, nut butters, eggs, cheese, and yogurt.  Eat or suck on things that have ginger in them. It may help to relieve nausea. Add  tsp (0.44 g) ground ginger to hot tea, or choose ginger tea.   Fluids It is important to stay hydrated. Try to:  Drink small amounts of fluids often.  Drink fluids 30 minutes before or after a meal to help lessen the feeling of a full stomach.  Drink 100% fruit juice or an electrolyte drink. An electrolyte drink contains sodium,  potassium, and chloride.  Drink fluids that are cold, clear, and carbonated or sour. These include lemonade, ginger ale, lemon-lime soda, ice water, and sparkling water. Things to avoid Avoid the following:  Eating foods that trigger your symptoms. These may include spicy foods, coffee, high-fat foods, very sweet foods, and acidic foods.  Drinking more than 1 cup of fluid at a time.  Skipping meals. Nausea can be more intense on an empty stomach. If you cannot tolerate food, do not force it. Try sucking on ice chips or other frozen items and make up for missed calories later.  Lying down within 2 hours after eating.  Being exposed to environmental triggers. These may include food smells, smoky rooms, closed spaces, rooms with strong smells, warm or humid places, overly loud and noisy rooms, and rooms with  motion or flickering lights. Try eating meals in a well-ventilated area that is free of strong smells.  Making quick and sudden changes in your movement.  Taking iron pills and multivitamins that contain iron. If you take prescription iron pills, do not stop taking them unless your health care provider approves.  Preparing food. The smell of food can spoil your appetite or trigger nausea. General instructions  Brush your teeth or use a mouth rinse after meals.  Take over-the-counter and prescription medicines only as told by your health care provider.  Follow instructions from your health care provider about eating or drinking restrictions.  Talk with your health care provider about starting a supplement of vitamin B6.  Continue to take your prenatal vitamins as told by your health care provider. If you are having trouble taking your prenatal vitamins, talk with your health care provider about other options.  Keep all follow-up visits. This is important. Follow-up visits include prenatal visits. Contact a health care provider if:  You have pain in your abdomen.  You have a  severe headache.  You have vision problems.  You are losing weight.  You feel weak or dizzy.  You cannot eat or drink without vomiting, especially if this goes on for a full day. Get help right away if:  You cannot drink fluids without vomiting.  You vomit blood.  You have constant nausea and vomiting.  You are very weak.  You faint.  You have a fever and your symptoms suddenly get worse. Summary  Hyperemesis gravidarum is a severe form of nausea and vomiting that happens during pregnancy.  Making some changes to your eating habits may help relieve nausea and vomiting.  This condition may be managed with lifestyle changes and medicines as prescribed by your health care provider.  If medicines do not help relieve nausea and vomiting, you may need to receive fluids through an IV at the hospital. This information is not intended to replace advice given to you by your health care provider. Make sure you discuss any questions you have with your health care provider. Document Revised: 04/24/2020 Document Reviewed: 04/24/2020 Elsevier Patient Education  2021 Elsevier Inc.        Rh Incompatibility Rh incompatibility is a condition that occurs during pregnancy if a woman has Rh-negative blood and her baby has Rh-positive blood. "Rh-negative" and "Rh-positive" refer to whether or not the blood has a specific protein found on the surface of red blood cells (Rh factor). If a woman has Rh factor, she is Rh-positive. If she does not have an Rh factor, she is Rh-negative. Having or not having an Rh factor does not affect the mother's general health. However, it can cause problems during pregnancy. What kind of problems can Rh incompatibility cause? During pregnancy, blood from the baby can cross into the mother's bloodstream, especially during delivery. If a mother is Rh-negative and the baby is Rh-positive, the mother's defense (immune) system will react to the baby's blood as if it  were a foreign substance and will create certain proteins (antibodies) in response to it. This process is called sensitization. Once the mother is sensitized, her Rh antibodies will cross the placenta in future pregnancies and attack the baby's Rh-positive blood as if it were a harmful substance. Rh incompatibility can also happen if a pregnant woman who is Rh-negative receives a donation (transfusion) of Rh-positive blood. How does this condition affect my baby? The Rh antibodies that attack and destroy your baby's red blood cells  can lead to hemolytic disease in the baby. This is a condition in which red blood cells break down. This can cause:  Yellowing of the skin and the whites of the eyes (jaundice).  Fewer red blood cells in the body (anemia).  Brain damage.  Heart failure.  Death. These antibodies usually do not cause problems during a woman's first pregnancy. This is because blood from the baby often crosses into the mother's bloodstream during delivery, and the baby is born before many of the antibodies can develop. However, once antibodies have formed, they stay in a woman's body. Because of this, Rh incompatibility is more likely to cause problems in second or later pregnancies (if the baby is Rh-positive). How is this diagnosed? When you become pregnant, you may have blood tests to determine your blood type and Rh factor. If you are Rh-negative, you may have another blood test called an antibody screen. The antibody screen shows whether you have Rh antibodies in your blood. If you do, it may mean that you have been exposed to Rh-positive blood before, and that you have a risk for Rh incompatibility. To find out whether your baby is developing hemolytic anemia and how serious it is, health care providers may use more advanced tests, such as ultrasound.   How is Rh incompatibility treated? This condition is treated with two shots (injections) of medicine called Rho (D) immune globulin.  This medicine keeps your body from making antibodies that can cause serious problems for your baby or for future babies. You will get one shot around your seventh month of pregnancy and the other within 72 hours of your baby's birth. If you are Rh-negative, you will need this medicine every time you have a baby with Rh-positive blood. If you are Rh-negative and there is a high risk of blood transfer between you and your baby, you may be given Rho (D) immune globulin. The risk of blood transfer is high if you experience:  Amniocentesis. This is a procedure to remove a small amount of the fluid that surrounds a baby in the uterus (amniotic fluid) so that it can be tested.  A miscarriage or an abortion.  An ectopic pregnancy. This is a pregnancy in which the fertilized egg attaches (implants) outside the uterus.  Any vaginal bleeding during pregnancy. If you already have antibodies in your blood, your pregnancy will be closely monitored. You will not be given Rho (D) immune globulin because it is not effective in these cases. Summary  Rh incompatibility is a condition that occurs during pregnancy if a woman has Rh-negative blood and her baby has Rh-positive blood.  If a mother is Rh-negative and the baby is Rh-positive, the mother's immune system will react to the baby's blood as if it were a foreign substance, and will create antibodies.  Rh antibodies that attack and destroy the baby's red blood cells can lead to hemolytic disease in the baby.  This condition is treated with a shot of medicine called Rho (D) immune globulin. This medicine keeps the woman's body from making antibodies that can cause serious problems in the baby or future babies. This information is not intended to replace advice given to you by your health care provider. Make sure you discuss any questions you have with your health care provider. Document Revised: 09/12/2017 Document Reviewed: 11/26/2016 Elsevier Patient  Education  2021 [employer].  Safe Medications in Pregnancy    Acne: Benzoyl Peroxide Salicylic Acid  Backache/Headache: Tylenol: 2 regular strength every 4 hours OR              2 Extra strength every 6 hours  Colds/Coughs/Allergies: Benadryl (alcohol free) 25 mg every 6 hours as needed Breath right strips Claritin Cepacol throat lozenges Chloraseptic throat spray Cold-Eeze- up to three times per day Cough drops, alcohol free Flonase (by prescription only) Guaifenesin Mucinex Robitussin DM (plain only, alcohol free) Saline nasal spray/drops Sudafed (pseudoephedrine) & Actifed ** use only after [redacted] weeks gestation and if you do not have high blood pressure Tylenol Vicks Vaporub Zinc lozenges Zyrtec   Constipation: Colace Ducolax suppositories Fleet enema Glycerin suppositories Metamucil Milk of magnesia Miralax Senokot Smooth move tea  Diarrhea: Kaopectate Imodium A-D  *NO pepto Bismol  Hemorrhoids: Anusol Anusol HC Preparation H Tucks  Indigestion: Tums Maalox Mylanta Zantac  Pepcid  Insomnia: Benadryl (alcohol free) 25mg  every 6 hours as needed Tylenol PM Unisom, no Gelcaps  Leg Cramps: Tums MagGel  Nausea/Vomiting:  Bonine Dramamine Emetrol Ginger extract Sea bands Meclizine  Nausea medication to take during pregnancy:  Unisom (doxylamine succinate 25 mg tablets) Take one tablet daily at bedtime. If symptoms are not adequately controlled, the dose can be increased to a maximum recommended dose of two tablets daily (1/2 tablet in the morning, 1/2 tablet mid-afternoon and one at bedtime). Vitamin B6 100mg  tablets. Take one tablet twice a day (up to 200 mg per day).  Skin Rashes: Aveeno products Benadryl cream or 25mg  every 6 hours as needed Calamine Lotion 1% cortisone cream  Yeast infection: Gyne-lotrimin 7 Monistat 7   **If taking multiple medications, please check labels to avoid duplicating  the same active ingredients **take medication as directed on the label ** Do not exceed 4000 mg of tylenol in 24 hours **Do not take medications that contain aspirin or ibuprofen          Prenatal Care Providers           [facility] @ [facility] - accepts patients without insurance  Phone: [phone]  [facility] for @ [facility]   Phone: [phone]  [facility] @[place]       Phone: [phone]            [facility] @ [place]     Phone: [phone]          [facility] [id number] Healthcare @ [facility]   Phone: [phone]  [facility] @ [facility] - accepts patients without insurance  Phone: [phone]  [facility] @ [facility] Phone: [phone]     [facility] Department - accepts patients without insurance Phone: [phone]  [facility]  Phone: [phone]  [facility] Phone: [phone]  [facility] Phone: [phone]  [facility] Phone: [phone]  [facility] Phone: [phone]  [facility]  Phone: [phone]

## 2021-02-06 NOTE — MAU Note (Addendum)
Went to UC this morning for vomiting and pain in upper stomach.  Started throwing on Sunday.  Pain started Sat night, a little cramping.  Feels dehydrated or something.  Has not thrown up since she left UC.

## 2021-02-06 NOTE — MAU Provider Note (Signed)
History     CSN: [id number]  Arrival date and time: 02/06/21 1118   Event Date/Time   First Provider Initiated Contact with Patient 02/06/21 1322      Chief Complaint  Patient presents with  . Emesis  . Nausea   Cassandra Ali is a 25 y.o. G1P0 at [redacted]w[redacted]d who presents to MAU for nausea and vomiting. Patient was seen at Urgent Care this morning and sent to MAU for further evaluation given upper abdominal pain in the setting of a positive pregnancy test. Patient reports she went out to eat this weekend with friends, and since Saturday evening has been vomiting about 6-7 times per day. Patient reports another friend got sick as well after eating at [employer] this weekend with her. Patient reports intermittent epigastric pain that is mostly present when vomiting.  Pt denies VB, LOF, ctx, decreased FM, vaginal discharge/odor/itching. Pt denies constipation, diarrhea, or urinary problems. Pt denies fever, chills, fatigue, sweating or changes in appetite. Pt denies SOB or chest pain. Pt denies dizziness, HA, light-headedness, weakness.  Problems this pregnancy include: pt has not yet been seen. Allergies? NKDA Current medications/supplements? PNV Prenatal care provider? Pt requests list of OB providers   OB History    Gravida  1   Para      Term      Preterm      AB      Living        SAB      IAB      Ectopic      Multiple      Live Births              Past Medical History:  Diagnosis Date  . Medical history non-contributory     Past Surgical History:  Procedure Laterality Date  . NO PAST SURGERIES      Family History  Problem Relation Age of Onset  . Healthy Mother   . Healthy Father     Social History   Tobacco Use  . Smoking status: Never Smoker  . Smokeless tobacco: Never Used  Substance Use Topics  . Alcohol use: No  . Drug use: No    Allergies: No Known Allergies  No medications prior to admission.    Review of Systems   Constitutional: Negative for chills, diaphoresis, fatigue and fever.  Eyes: Negative for visual disturbance.  Respiratory: Negative for shortness of breath.   Cardiovascular: Negative for chest pain.  Gastrointestinal: Positive for abdominal pain, nausea and vomiting. Negative for constipation and diarrhea.  Genitourinary: Negative for dysuria, flank pain, frequency, pelvic pain, urgency, vaginal bleeding and vaginal discharge.  Neurological: Negative for dizziness, weakness, light-headedness and headaches.   Physical Exam   Blood pressure 129/67, pulse (!) 58, temperature 98.4 F (36.9 C), temperature source Oral, resp. rate 17, height 5\' 8"  (1.727 m), weight 64.1 kg, last menstrual period 12/15/2020, SpO2 100 %.  Patient Vitals for the past 24 hrs:  BP Temp Temp src Pulse Resp SpO2 Height Weight  02/06/21 1202 129/67 98.4 F (36.9 C) Oral (!) 58 17 100 % 5\' 8"  (1.727 m) 64.1 kg   Physical Exam Constitutional:      General: She is not in acute distress.    Appearance: She is well-developed. She is not diaphoretic.  HENT:     Head: Normocephalic and atraumatic.  Pulmonary:     Effort: Pulmonary effort is normal.  Abdominal:     General: There is no distension.  Palpations: Abdomen is soft. There is no mass.     Tenderness: There is no abdominal tenderness. There is no guarding or rebound.  Skin:    General: Skin is warm and dry.  Neurological:     Mental Status: She is alert and oriented to person, place, and time.  Psychiatric:        Behavior: Behavior normal.        Thought Content: Thought content normal.        Judgment: Judgment normal.    Results for orders placed or performed during the hospital encounter of 02/06/21 (from the past 24 hour(s))  Urinalysis, Routine w reflex microscopic Urine, Clean Catch     Status: Abnormal   Collection Time: 02/06/21  1:46 PM  Result Value Ref Range   Color, Urine YELLOW YELLOW   APPearance HAZY (A) CLEAR   Specific  Gravity, Urine 1.021 1.005 - 1.030   pH 6.0 5.0 - 8.0   Glucose, UA NEGATIVE NEGATIVE mg/dL   Hgb urine dipstick NEGATIVE NEGATIVE   Bilirubin Urine NEGATIVE NEGATIVE   Ketones, ur 20 (A) NEGATIVE mg/dL   Protein, ur NEGATIVE NEGATIVE mg/dL   Nitrite NEGATIVE NEGATIVE   Leukocytes,Ua NEGATIVE NEGATIVE  CBC with Differential/Platelet     Status: Abnormal   Collection Time: 02/06/21  1:46 PM  Result Value Ref Range   WBC 15.2 (H) 4.0 - 10.5 K/uL   RBC 4.94 3.87 - 5.11 MIL/uL   Hemoglobin 14.8 12.0 - 15.0 g/dL   HCT 73.7 10.6 - 26.9 %   MCV 90.1 80.0 - 100.0 fL   MCH 30.0 26.0 - 34.0 pg   MCHC 33.3 30.0 - 36.0 g/dL   RDW 48.5 46.2 - 70.3 %   Platelets 338 150 - 400 K/uL   nRBC 0.0 0.0 - 0.2 %   Neutrophils Relative % 83 %   Neutro Abs 12.7 (H) 1.7 - 7.7 K/uL   Lymphocytes Relative 10 %   Lymphs Abs 1.5 0.7 - 4.0 K/uL   Monocytes Relative 6 %   Monocytes Absolute 0.9 0.1 - 1.0 K/uL   Eosinophils Relative 0 %   Eosinophils Absolute 0.0 0.0 - 0.5 K/uL   Basophils Relative 0 %   Basophils Absolute 0.1 0.0 - 0.1 K/uL   Immature Granulocytes 1 %   Abs Immature Granulocytes 0.08 (H) 0.00 - 0.07 K/uL  Comprehensive metabolic panel     Status: Abnormal   Collection Time: 02/06/21  1:46 PM  Result Value Ref Range   Sodium 131 (L) 135 - 145 mmol/L   Potassium 2.9 (L) 3.5 - 5.1 mmol/L   Chloride 91 (L) 98 - 111 mmol/L   CO2 26 22 - 32 mmol/L   Glucose, Bld 85 70 - 99 mg/dL   BUN 11 6 - 20 mg/dL   Creatinine, Ser 5.00 0.44 - 1.00 mg/dL   Calcium 9.9 8.9 - 93.8 mg/dL   Total Protein 8.6 (H) 6.5 - 8.1 g/dL   Albumin 4.2 3.5 - 5.0 g/dL   AST 30 15 - 41 U/L   ALT 28 0 - 44 U/L   Alkaline Phosphatase 62 38 - 126 U/L   Total Bilirubin 0.7 0.3 - 1.2 mg/dL   GFR, Estimated >18 >29 mL/min   Anion gap 14 5 - 15  hCG, quantitative, pregnancy     Status: Abnormal   Collection Time: 02/06/21  1:46 PM  Result Value Ref Range   hCG, Beta Chain, Quant, S 49,856 (H) <5 mIU/mL  ABO/Rh      Status: None   Collection Time: 02/06/21  1:46 PM  Result Value Ref Range   ABO/RH(D) O NEG    Antibody Screen      NEG Performed at [facility], [address], [place], [place] [address]   Wet prep, genital     Status: Abnormal   Collection Time: 02/06/21  2:55 PM  Result Value Ref Range   Yeast Wet Prep HPF POC NONE SEEN NONE SEEN   Trich, Wet Prep NONE SEEN NONE SEEN   Clue Cells Wet Prep HPF POC PRESENT (A) NONE SEEN   WBC, Wet Prep HPF POC FEW (A) NONE SEEN   Sperm NONE SEEN    US OB Comp Less 14 Wks  Result Date: 02/06/2021 CLINICAL DATA:  Pelvic pain for 4 days. EXAM: OBSTETRIC <14 WK ULTRASOUND TECHNIQUE: Transabdominal ultrasound was performed for evaluation of the gestation as well as the maternal uterus and adnexal regions. COMPARISON:  None. FINDINGS: Intrauterine gestational sac: Single Yolk sac:  Visualized. Embryo:  Visualized. Cardiac Activity: Visualized. Heart Rate: 147 bpm CRL:   11 mm   7 w 1 d                  [place] EDC: 09/24/2021 Subchorionic hemorrhage:  None visualized. Maternal uterus/adnexae: Both ovaries are normal in appearance. No mass or abnormal free fluid identified. IMPRESSION: Single living IUP with estimated gestational age of [redacted] weeks 1 day, and [place] EDC of 09/24/2021. No maternal uterine or adnexal abnormality identified. Electronically Signed   By: Danae Orleans M.D.   On: 02/06/2021 15:40    MAU Course  Procedures  MDM -r/o ectopic -[place]: single IUP, + yolk sac, +embryo, +[id number], [redacted]w[redacted]d -hCG: pending at time of discharge -ABO: O NEGATIVE -WetPrep: +ClueCells (isolated finding not requiring treatment) -GC/CT collected -pt discharged to home in stable condition  -N/V in pregnancy -UA: hazy/20ketones (was 80 ketones at Urgent Care) -CBC w/Diff: left shift, no abnormalities requiring treatment -CMP: Na 131, K 2.9, Cl 91 -1L LR + 20mg  Pepcid + 10mg  Reglan given, pt reports N/V and abdominal pain now resolved -pt able to urinate after fluids -PO  challenge successful -consulted with Dr. regarding CMP, who recommends a trial of oral K plus and EKG, if patient can tolerate oral iron can be discharged home with oral RX -EKG: no concerns, per consult with cardiology -pt able to tolerate oral potassium -pt discharged to home in stable condition  Orders Placed This Encounter  Procedures  . Wet prep, genital    Standing Status:   Standing    Number of Occurrences:   1  . OB Comp Less 14 Wks    Standing Status:   Standing    Number of Occurrences:   1    Order Specific Question:   Symptom/Reason for Exam    Answer:   Abdominal pain affecting pregnancy [facility]  . Urinalysis, Routine w reflex microscopic Urine, Clean Catch    Standing Status:   Standing    Number of Occurrences:   1  . CBC with Differential/Platelet    Standing Status:   Standing    Number of Occurrences:   1  . Comprehensive metabolic panel    Standing Status:   Standing    Number of Occurrences:   1  . hCG, quantitative, pregnancy    Standing Status:   Standing    Number of Occurrences:   1  . EKG 12-Lead  Standing Status:   Standing    Number of Occurrences:   1  . ABO/Rh    Standing Status:   Standing    Number of Occurrences:   1  . Insert peripheral IV    Standing Status:   Standing    Number of Occurrences:   1  . Discharge patient    Order Specific Question:   Discharge disposition    Answer:   01-Home or Self Care [1]    Order Specific Question:   Discharge patient date    Answer:   02/06/2021   Meds ordered this encounter  Medications  . lactated ringers bolus 1,000 mL  . famotidine (PEPCID) IVPB 20 mg premix  . metoCLOPramide (REGLAN) injection 10 mg  . potassium chloride (KLOR-CON) CR tablet 10 mEq  . famotidine (PEPCID) 20 MG tablet    Sig: Take 1 tablet (20 mg total) by mouth daily.    Dispense:  30 tablet    Refill:  1    Order Specific Question:   Supervising Provider    Answer:   [place] Bing [id number]  .  metoCLOPramide (REGLAN) 10 MG tablet    Sig: Take 1 tablet (10 mg total) by mouth 3 (three) times daily with meals.    Dispense:  90 tablet    Refill:  1    Order Specific Question:   Supervising Provider    Answer:   [place] Bing [id number]  . potassium chloride (KLOR-CON) 10 MEQ tablet    Sig: Take 1 tablet (10 mEq total) by mouth daily.    Dispense:  8 tablet    Refill:  0    Order Specific Question:   Supervising Provider    Answer:   [place] [facility] [[id number]]   Assessment and Plan    1. Nausea and vomiting in pregnancy   2. Abdominal pain affecting pregnancy   3. Hypokalemia   4. [redacted] weeks gestation of pregnancy     Allergies as of 02/06/2021   No Known Allergies     Medication List    TAKE these medications   famotidine 20 MG tablet Commonly known as: Pepcid Take 1 tablet (20 mg total) by mouth daily.   metoCLOPramide 10 MG tablet Commonly known as: REGLAN Take 1 tablet (10 mg total) by mouth 3 (three) times daily with meals.   potassium chloride 10 MEQ tablet Commonly known as: KLOR-CON Take 1 tablet (10 mEq total) by mouth daily.       -will call with culture results, if positive -discussed RH negative status and implications for pregnancy  -safe meds in pregnancy list given -list of OB providers given -RX Kdur -RX Reglan -RX Pepcid -pt to start [facility] -return MAU precautions given -pt discharged to home in stable condition  Cassandra Ali 02/06/2021, 4:59 PM

## 2021-02-07 LAB — GC/CHLAMYDIA PROBE AMP (~~LOC~~) NOT AT ARMC
Chlamydia: NEGATIVE
Comment: NEGATIVE
Comment: NORMAL
Neisseria Gonorrhea: NEGATIVE

## 2021-12-14 ENCOUNTER — Ambulatory Visit (HOSPITAL_COMMUNITY)
Admission: EM | Admit: 2021-12-14 | Discharge: 2021-12-14 | Disposition: A | Discharge status: Home / Self Care | Payer: 59 | Attending: Nurse Practitioner | Admitting: Nurse Practitioner

## 2021-12-14 ENCOUNTER — Encounter (HOSPITAL_COMMUNITY): Payer: Self-pay

## 2021-12-14 ENCOUNTER — Other Ambulatory Visit: Payer: Self-pay

## 2021-12-14 DIAGNOSIS — S46912A Strain of unspecified muscle, fascia and tendon at shoulder and upper arm level, left arm, initial encounter: Secondary | ICD-10-CM

## 2021-12-14 DIAGNOSIS — M25512 Pain in left shoulder: Secondary | ICD-10-CM | POA: Diagnosis not present

## 2021-12-14 MED ORDER — IBUPROFEN 800 MG PO TABS: 800.0000 mg | ORAL_TABLET | Freq: Three times a day (TID) | ORAL | 0 refills | Status: AC | PRN

## 2021-12-14 NOTE — ED Provider Notes (Signed)
?[facility] ? ? ? ?CSN: [id number] ?Arrival date & time: 12/14/21  1606 ? ? ?  ? ?History   ?Chief Complaint ?Chief Complaint  ?Patient presents with  ? Shoulder Pain  ? ? ?HPI ?Cassandra Ali is a 26 y.o. female.  ? ?The patient is a 26 year old female who presents with left shoulder pain.  Symptoms have been present for the past week and a half.  She states they have been worsening since that time.  She has pain on the anterior, posterior, and lateral aspect of the left shoulder.  Pain is present with abduction, abduction, and external rotation.  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  She states over the last week since she has had symptoms she has not been lifting as heavy.  She denies any neck pain, numbness, tingling, or weakness in the left upper extremity.  She also denies radiation of pain.  She denies any previous injury or trauma to the left shoulder.  She has not taken any medication for symptoms but informs she has been using IcyHot as needed. ? ? ?Shoulder Pain ? ?Past Medical History:  ?Diagnosis Date  ? Medical history non-contributory   ? ? ?There are no problems to display for this patient. ? ? ?Past Surgical History:  ?Procedure Laterality Date  ? NO PAST SURGERIES    ? ? ?OB History   ? ? Gravida  ?1  ? Para  ?   ? Term  ?   ? Preterm  ?   ? AB  ?   ? Living  ?   ?  ? ? SAB  ?   ? IAB  ?   ? Ectopic  ?   ? Multiple  ?   ? Live Births  ?   ?   ?  ?  ? ? ? ?Home Medications   ? ?Prior to Admission medications   ?Medication Sig Start Date End Date Taking? Authorizing Provider  ?famotidine (PEPCID) 20 MG tablet Take 1 tablet (20 mg total) by mouth daily. 02/06/21 02/06/22  Nugent, Odie Sera, NP  ?metoCLOPramide (REGLAN) 10 MG tablet Take 1 tablet (10 mg total) by mouth 3 (three) times daily with meals. 02/06/21 02/06/22  Nugent, Odie Sera, NP  ?potassium chloride (KLOR-CON) 10 MEQ tablet Take 1 tablet (10 mEq total) by mouth daily. 02/06/21   Nugent,  Odie Sera, NP  ? ? ?Family History ?Family History  ?Problem Relation Age of Onset  ? Healthy Mother   ? Healthy Father   ? ? ?Social History ?Social History  ? ?Tobacco Use  ? Smoking status: Never  ? Smokeless tobacco: Never  ?Substance Use Topics  ? Alcohol use: No  ? Drug use: No  ? ? ? ?Allergies   ?Patient has no known allergies. ? ? ?Review of Systems ?Review of Systems  ?Constitutional: Negative.   ?Musculoskeletal:   ?     Left shoulder pain.  ?Skin: Negative.   ?Psychiatric/Behavioral: Negative.    ? ? ?Physical Exam ?Triage Vital Signs ?ED Triage Vitals  ?Enc Vitals Group  ?   BP 12/14/21 1710 106/68  ?   Pulse Rate 12/14/21 1710 (!) 52  ?   Resp 12/14/21 1710 18  ?   Temp 12/14/21 1710 97.9 ?F (36.6 ?C)  ?   Temp Source 12/14/21 1710 Oral  ?   SpO2 12/14/21 1710 98 %  ?  Weight --   ?   Height --   ?   Head Circumference --   ?   Peak Flow --   ?   Pain Score 12/14/21 1708 7  ?   Pain Loc --   ?   Pain Edu? --   ?   Excl. in [place]? --   ? ?No data found. ? ?Updated Vital Signs ?BP 106/68 (BP Location: Right Arm)   Pulse (!) 52   Temp 97.9 ?F (36.6 ?C) (Oral)   Resp 18   LMP 12/12/2021   SpO2 98%   Breastfeeding Unknown  ? ?Visual Acuity ?Right Eye Distance:   ?Left Eye Distance:   ?Bilateral Distance:   ? ?Right Eye Near:   ?Left Eye Near:    ?Bilateral Near:    ? ?Physical Exam ?Constitutional:   ?   General: She is not in acute distress. ?   Appearance: Normal appearance. She is normal weight.  ?HENT:  ?   Head: Normocephalic.  ?Musculoskeletal:  ?   Left shoulder: Tenderness (anterior, lateral and posterior shoulder, + painful arc, left trapezius tenderness) present. No swelling, deformity, effusion or crepitus. Decreased range of motion. Normal pulse.  ?   Comments: Pain with adduction and abduction, external rotation  ?Neurological:  ?   Mental Status: She is alert.  ? ? ? ?UC Treatments / Results  ?Labs ?(all labs ordered are listed, but only abnormal results are displayed) ?Labs Reviewed - No  data to display ? ?EKG ? ? ?Radiology ?No results found. ? ?Procedures ?Procedures (including critical care time) ? ?Medications Ordered in UC ?Medications - No data to display ? ?Initial Impression / Assessment and Plan / UC Course  ?I have reviewed the triage vital signs and the nursing notes. ? ?Pertinent labs & imaging results that were available during my care of the patient were reviewed by me and considered in my medical decision making (see chart for details). ? ?Symptoms are consistent with a left shoulder drain.  Unsure of the mechanism that may be causing her symptoms, but patient with decreased range of motion and tenderness in the left shoulder.  I recommended starting with NSAIDs to see if this helps calm any inflammation.  Also recommended the use of ice to the left shoulder, on for 20 minutes, off for 1 hour and then repeat.  Recommended she try not to lift any heavy items for the next 1 to 2 weeks.  Patient instructed to follow-up with Ortho if symptoms worsen or do not improve.  Informed that she can follow-up with [facility] care.  Information was provided to the patient.  Patient instructed to follow-up if symptoms worsen or do not improve. ? ? ?Final Clinical Impressions(s) / UC Diagnoses  ? ?Final diagnoses:  ?None  ? ?Discharge Instructions   ?None ?  ? ?ED Prescriptions   ?None ?  ? ?PDMP not reviewed this encounter. ?  ?Tish Men, NP ?12/14/21 1745 ? ?

## 2021-12-14 NOTE — ED Triage Notes (Signed)
Pt c/o shoulder pain (left) for 2 weeks. 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  ?

## 2021-12-14 NOTE — Discharge Instructions (Signed)
Take medication as prescribed. ?Apply ice to the affected area, on for 20 minutes, off for 1 hour, and then repeat.   ?May continue the use of IcyHot as needed for pain or discomfort.   ?Avoid heavy lifting next 1 to 2 weeks. ?Follow-up with [facility] care if symptoms worsen or do not improve.  You can google this information to schedule an appointment. ?Follow-up as needed. ?

## 2022-01-27 IMAGING — US US OB COMP LESS 14 WK
1 series · 15 of 28 positions shown · non-contrast
Comparison: None.

CLINICAL DATA: Pelvic pain for 4 days.

EXAM:
OBSTETRIC <14 WK ULTRASOUND
TECHNIQUE: Transabdominal ultrasound was performed for evaluation of the
gestation as well as the maternal uterus and adnexal regions.

[Series 1: us ob comp less 14 wk · 32 acquisitions, 15 frames shown]
[im 1/32]
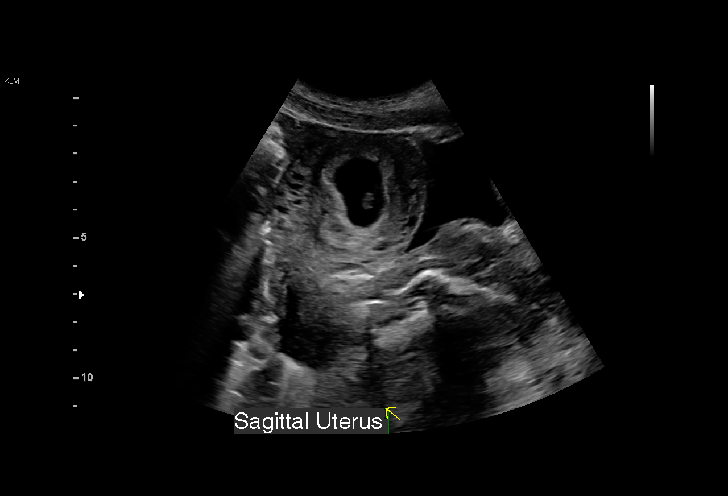
[im 3/32]
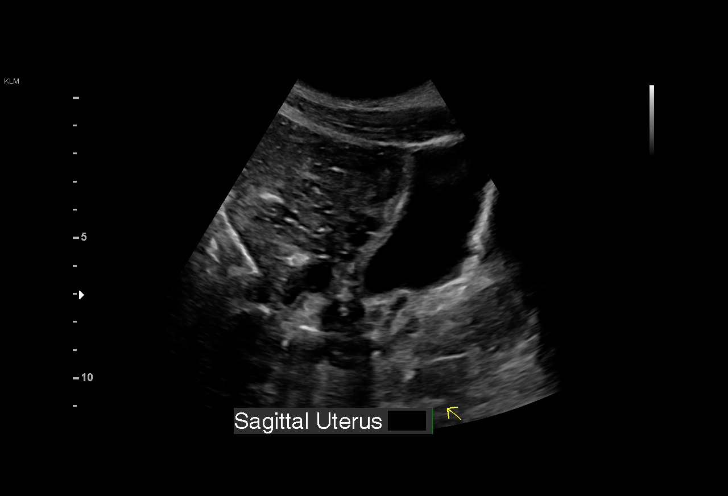
[im 5/32]
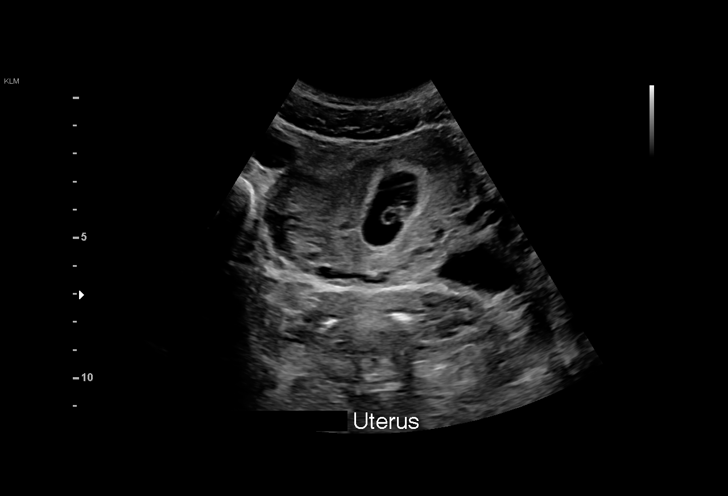
[im 7/32]
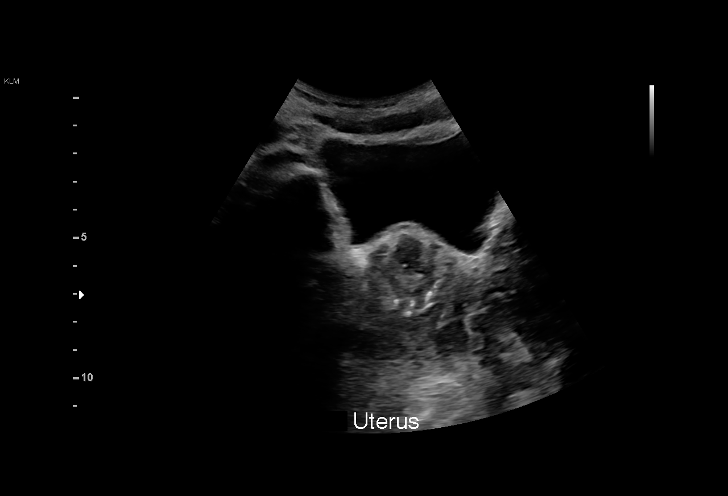
[im 10/32]
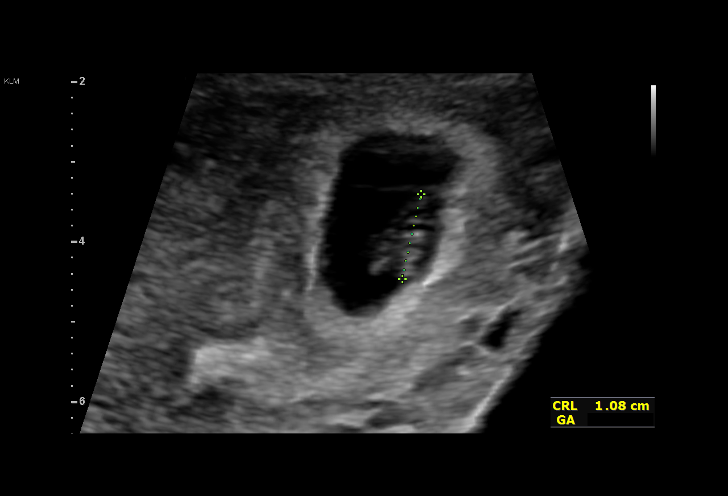
[im 12/32]
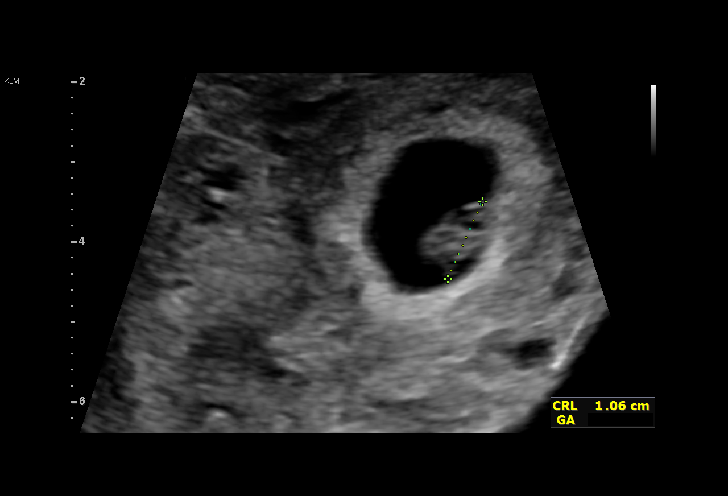
[im 14/32]
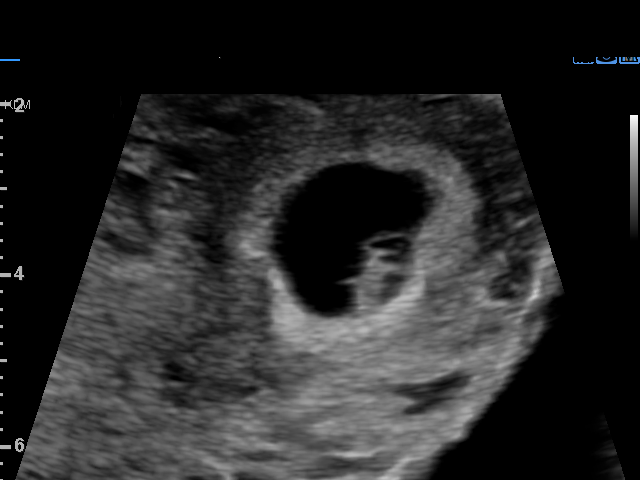
[im 17/32]
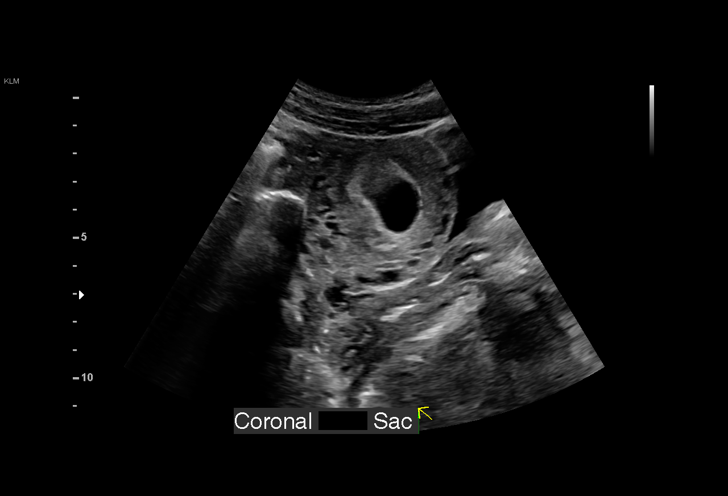
[im 18/32]
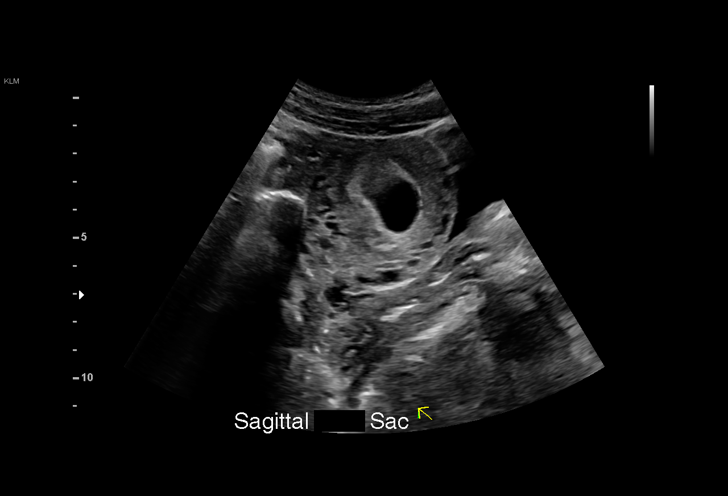
[im 20/32]
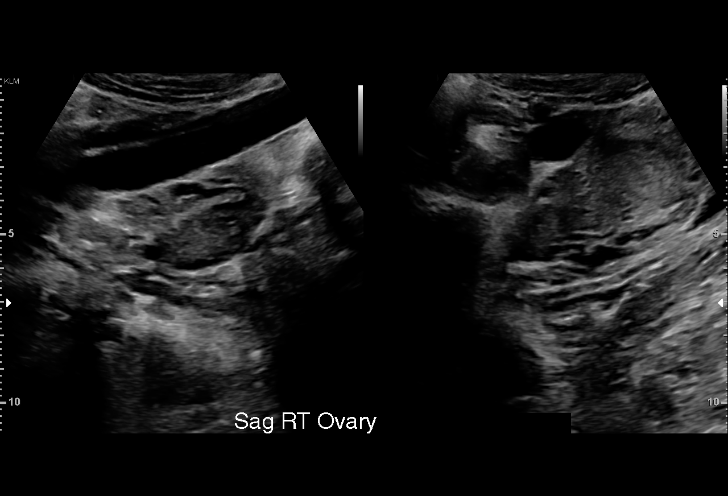
[im 22/32]
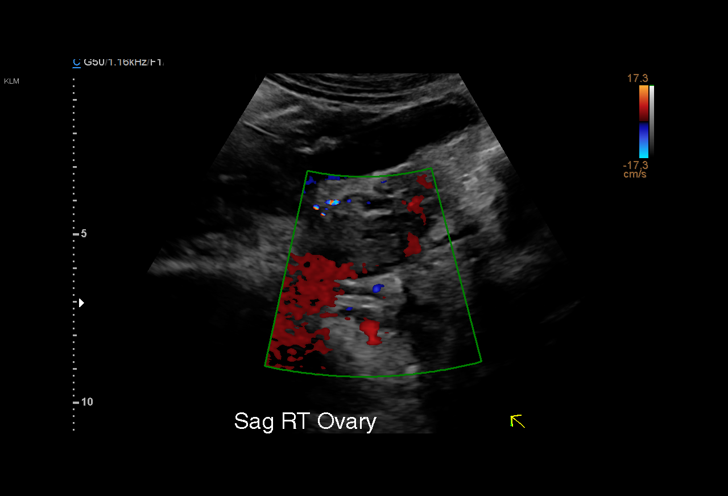
[im 25/32]
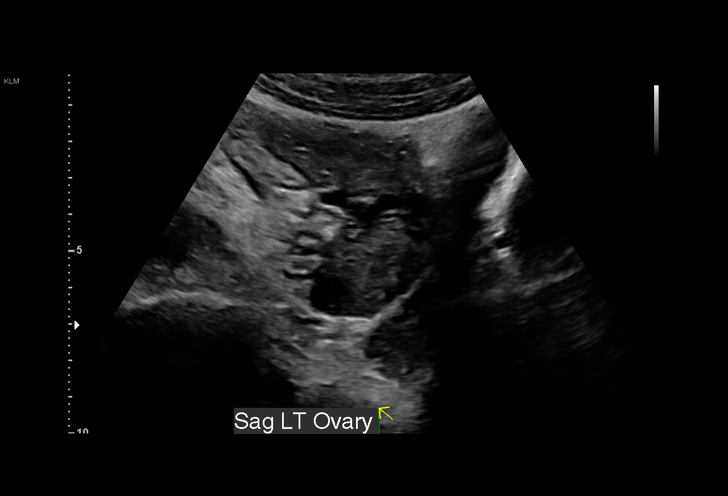
[im 27/32]
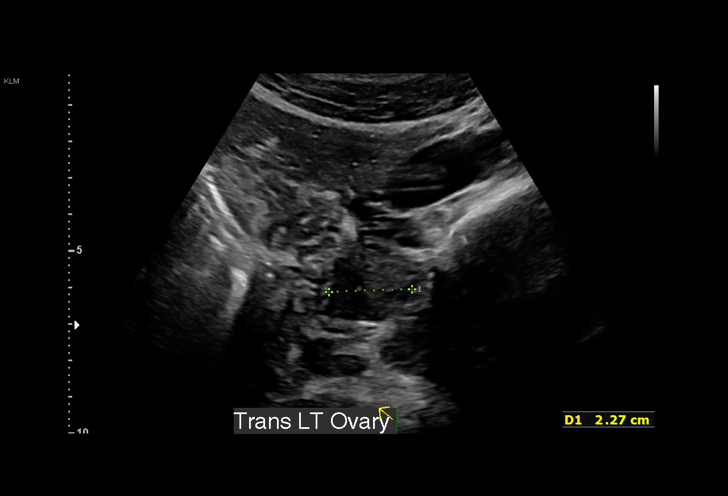
[im 29/32]
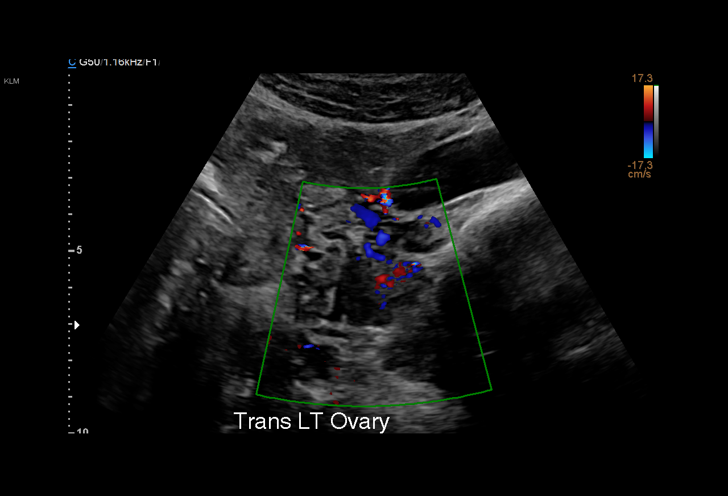
[im 32/32]
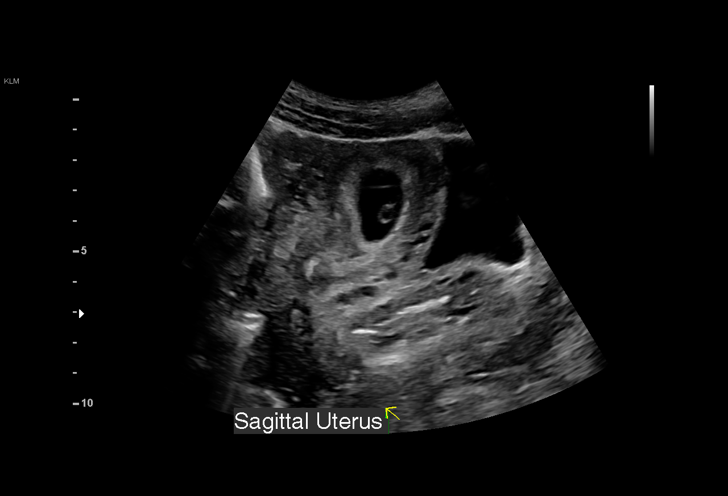

[15 of 28 positions shown; findings below may reference images not displayed]

FINDINGS: Intrauterine gestational sac: Single

Yolk sac:  Visualized.

Embryo:  Visualized.

Cardiac Activity: Visualized.

Heart Rate: 147 bpm

CRL:   11 mm   7 w 1 d                  US EDC: 09/24/2021

Subchorionic hemorrhage:  None visualized.

Maternal uterus/adnexae: Both ovaries are normal in appearance. No
mass or abnormal free fluid identified.
IMPRESSION: Single living IUP with estimated gestational age of 7 weeks 1 day,
and US EDC of 09/24/2021.

No maternal uterine or adnexal abnormality identified.
